# Patient Record
Sex: Female | Born: 1940 | Race: White | Hispanic: No
Health system: Southern US, Community
[De-identification: ages and names within clinical notes are randomized; demographics above are authoritative.]

---

## 2021-08-21 ENCOUNTER — Ambulatory Visit (INDEPENDENT_AMBULATORY_CARE_PROVIDER_SITE_OTHER): Payer: Medicare Other | Admitting: Family Medicine

## 2021-08-21 ENCOUNTER — Ambulatory Visit
Admission: RE | Admit: 2021-08-21 | Discharge: 2021-08-21 | Disposition: A | Discharge status: Home / Self Care | Payer: Medicare Other | Source: Ambulatory Visit | Attending: Family Medicine | Admitting: Family Medicine

## 2021-08-21 VITALS — BP 165/80 | Ht 62.5 in | Wt 145.0 lb

## 2021-08-21 DIAGNOSIS — M25551 Pain in right hip: Secondary | ICD-10-CM

## 2021-08-21 NOTE — Patient Instructions (Signed)
Get x-rays after you leave today. I will call you with the results and next steps. Aleve 2 tabs twice a day with food for pain and inflammation. Icing 15 minutes at a time if needed. I will want you to continue with physical therapy but probably not with the dry needling since we're talking about pain coming from the joint and not the muscles surrounding the joint. Follow up will depend on the x-ray results.

## 2021-08-21 NOTE — Progress Notes (Signed)
PCP: Laurena Bering, DO  Subjective:   HPI: Patient is a 81 y.o. female here for right hip pain.  Patient reports for over a month she's had anterior hip/groin pain. No acute injury or trauma. She does pilates but nothing in particular bothered her while she was doing this. She denies numbness, tingling. Has not been able to sleep well due to pain. No radiation of pain though feels some tension in right buttock. Tried advil, CBD. Seeing Karin Golden at [facility] and done dry needling.  No past medical history on file.  No current outpatient medications on file prior to visit.   No current facility-administered medications on file prior to visit.    No Known Allergies  BP (!) 165/80   Ht 5' 2.5" (1.588 m)   Wt 145 lb (65.8 kg)   BMI 26.10 kg/m      08/21/2021    2:04 PM  [facility]  Frequency of aerobic exercise (# of days/week) 3  Average time in minutes 45  Frequency of strengthening activities (# of days/week) 3        No data to display              Objective:  Physical Exam:  Gen: NAD, comfortable in exam room  Right hip: No deformity. Mod limitation ROM with IR and ER, painful.  5/5 strength hip flexion, knee extension, abduction.  No tenderness to palpation trochanter, ASIS. NVI distally. Positive logroll, fadir. Negative faber and piriformis stretches.   Assessment & Plan:  1. Right hip pain - exam consistent with intraarticular pathology.  Will obtain x-rays to assess for level of arthritis, FAI.  In meantime continue physical therapy, aleve, icing.

## 2021-08-22 ENCOUNTER — Encounter: Payer: Self-pay | Admitting: Family Medicine

## 2021-10-21 ENCOUNTER — Telehealth: Payer: Self-pay | Admitting: *Deleted

## 2021-10-21 ENCOUNTER — Other Ambulatory Visit: Payer: Self-pay | Admitting: *Deleted

## 2021-10-21 DIAGNOSIS — M25551 Pain in right hip: Secondary | ICD-10-CM

## 2021-10-21 NOTE — Telephone Encounter (Signed)
Patient left message on the referral line at [facility] wanting to discuss getting additional imaging done.  Will forward to [facility] office.  Jakobee Brackins,CMA

## 2021-10-21 NOTE — Telephone Encounter (Signed)
If she is not getting better we discussed intraarticular steroid injection or we could go ahead with an MRI given she's had x-rays and done over 6 weeks of physical therapy.  Ok to put in order if she prefers to do MRI.

## 2021-10-21 NOTE — Telephone Encounter (Signed)
MRI order entered and pt informed and will call to schedule the appt

## 2021-10-26 ENCOUNTER — Ambulatory Visit
Admission: RE | Admit: 2021-10-26 | Discharge: 2021-10-26 | Disposition: A | Discharge status: Home / Self Care | Payer: Medicare Other | Source: Ambulatory Visit | Attending: Family Medicine | Admitting: Family Medicine

## 2021-10-26 DIAGNOSIS — M25551 Pain in right hip: Secondary | ICD-10-CM

## 2023-08-23 IMAGING — CR DG HIP (WITH OR WITHOUT PELVIS) 2-3V*R*
2 series · 2 of 2 positions shown · non-contrast
Comparison: None Available.

CLINICAL DATA: Right-sided hip pain. Groin pain after exercise for
a month.

EXAM:
DG HIP (WITH OR WITHOUT PELVIS) 2-3V RIGHT

[t hip ap right]
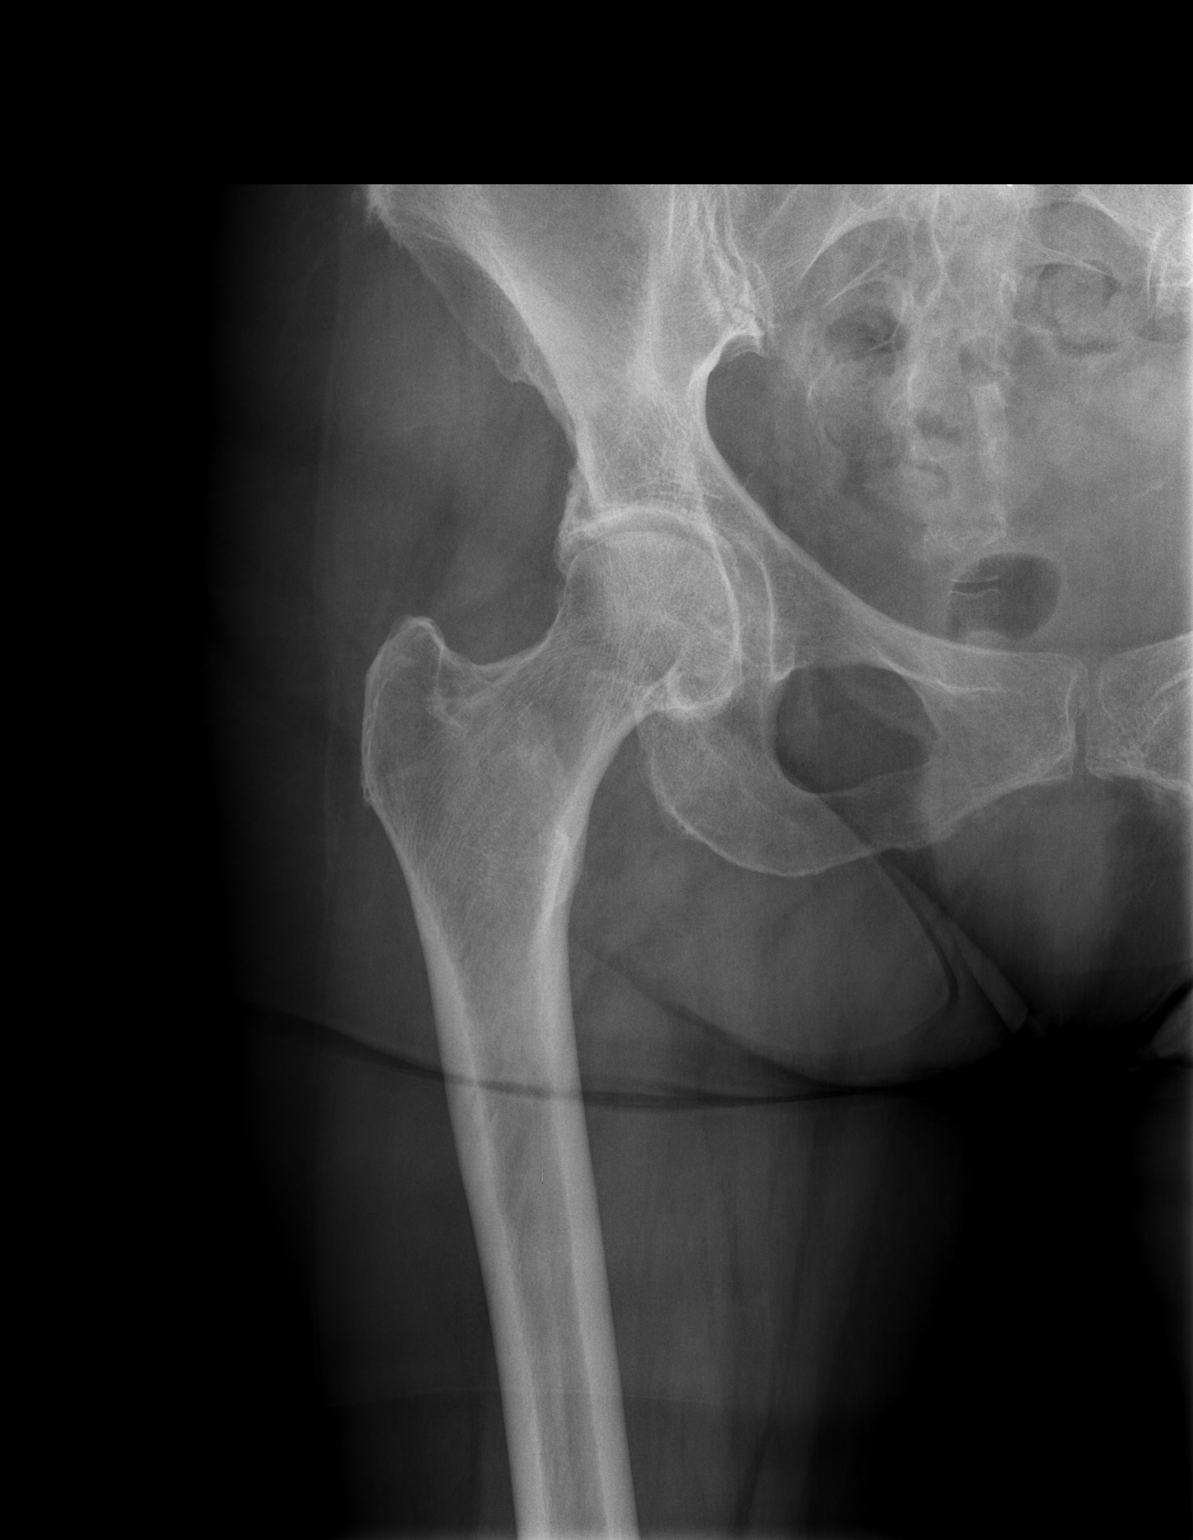

[t hip frog leg right]
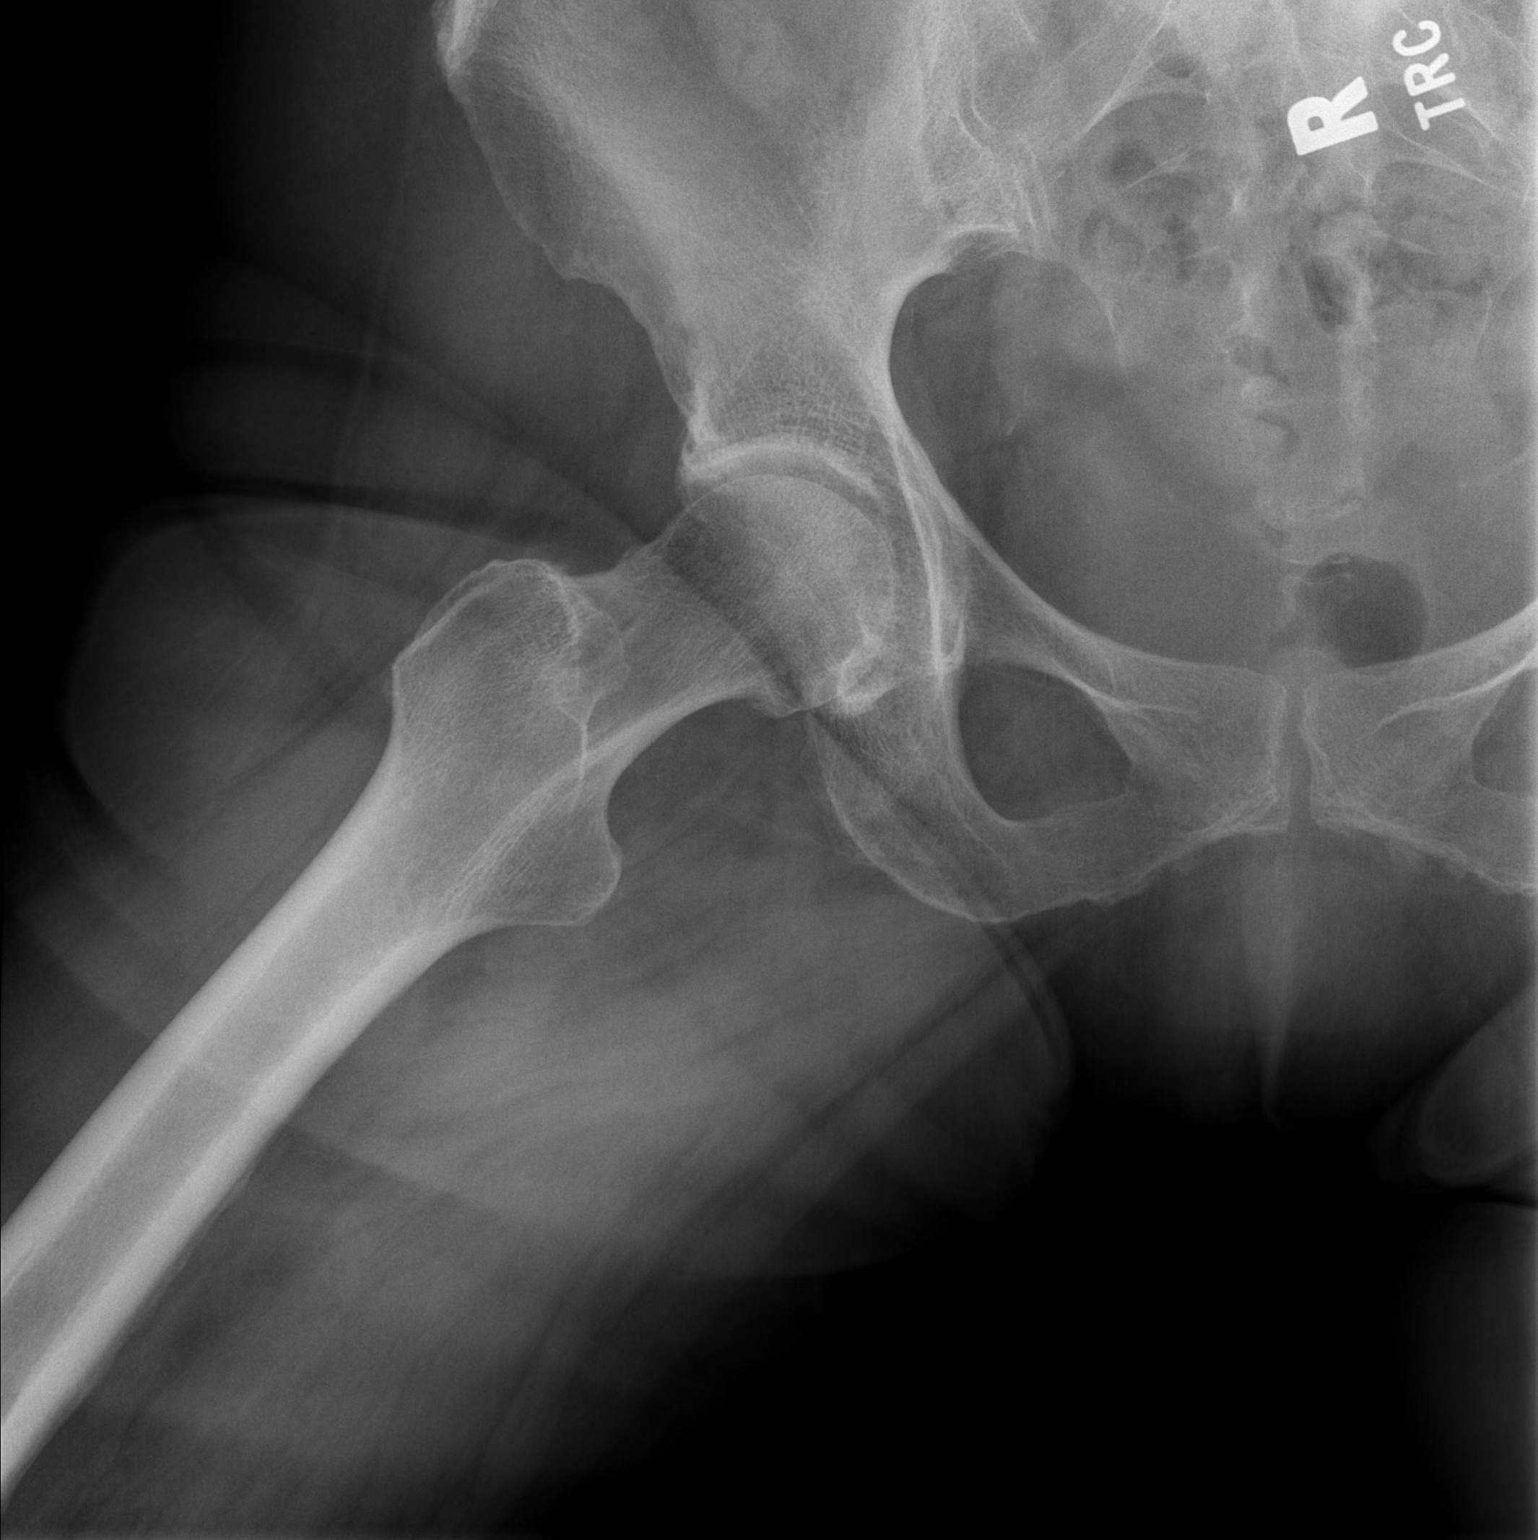

[2 of 2 positions shown; findings below may reference images not displayed]

FINDINGS: Mild degenerative changes in the hips without loss of joint space.
No fractures or dislocations.
IMPRESSION: Mild degenerative changes in the hips without loss of joint space.
